# Patient Record
Sex: Female | Born: 1995 | Hispanic: Refuse to answer | Marital: Single | Smoking: Never smoker
Health system: Southern US, Community
[De-identification: ages and names within clinical notes are randomized; demographics above are authoritative.]

---
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
## 2017-08-03 ENCOUNTER — Encounter: Payer: Self-pay | Admitting: Family Medicine

## 2017-08-03 ENCOUNTER — Ambulatory Visit: Payer: Self-pay | Admitting: Family Medicine

## 2017-08-03 VITALS — BP 105/59 | HR 57 | Temp 97.6°F | Resp 14

## 2017-08-03 DIAGNOSIS — S86899A Other injury of other muscle(s) and tendon(s) at lower leg level, unspecified leg, initial encounter: Secondary | ICD-10-CM

## 2017-08-03 NOTE — Progress Notes (Signed)
Patient presents today for blood work. Patient had a history of right tibial stress injury last year. Her vitamin D level and ferritin level were normal in 10/2016. She denies any stress injuries this year. She does deal with shin splints on occasion. She does take a vitamin D/calcium supplement but states that it is not a high dose. She also takes iron and a multivitamin daily. She denies any restrictions in her diet.  Exam deferred.  A/P: History of stress injury - will repeat vitamin D level today and ferritin level. Will inform patient of any need for further supplementation.

## 2017-08-04 LAB — VITAMIN D 25 HYDROXY (VIT D DEFICIENCY, FRACTURES): VIT D 25 HYDROXY: 40.7 ng/mL (ref 30.0–100.0)

## 2017-08-04 LAB — FERRITIN: Ferritin: 55 ng/mL (ref 15–77)

## 2017-08-31 ENCOUNTER — Ambulatory Visit (INDEPENDENT_AMBULATORY_CARE_PROVIDER_SITE_OTHER): Payer: Commercial Managed Care - HMO | Admitting: Family Medicine

## 2017-08-31 ENCOUNTER — Encounter: Payer: Self-pay | Admitting: Family Medicine

## 2017-08-31 DIAGNOSIS — M25562 Pain in left knee: Secondary | ICD-10-CM

## 2017-08-31 MED ORDER — DICLOFENAC SODIUM 75 MG PO TBEC: 75.0000 mg | DELAYED_RELEASE_TABLET | Freq: Two times a day (BID) | ORAL | 0 refills | Status: DC

## 2017-10-12 ENCOUNTER — Ambulatory Visit (INDEPENDENT_AMBULATORY_CARE_PROVIDER_SITE_OTHER): Payer: Commercial Managed Care - HMO | Admitting: Family Medicine

## 2017-10-12 ENCOUNTER — Encounter: Payer: Self-pay | Admitting: Family Medicine

## 2017-10-12 VITALS — BP 120/63 | HR 63 | Temp 98.1°F | Resp 14

## 2017-10-12 DIAGNOSIS — J029 Acute pharyngitis, unspecified: Secondary | ICD-10-CM

## 2017-10-12 LAB — POCT RAPID STREP A (OFFICE): RAPID STREP A SCREEN: NEGATIVE

## 2017-10-12 NOTE — Progress Notes (Signed)
Patient presents today with symptoms of sore throat and left ear pain. Patient denies any fever. She states that she has had some postnasal drip. She has had minimal cough. She has been taking Ibuprofen for her symptoms.  ROS: Negative except mentioned above. Vitals as per Epic. GENERAL: NAD HEENT: mild pharyngeal erythema, no exudate, no erythema of TMs, no erythema of TMs, no tenderness with movement of pinna, no discharge from the ears, no cervical LAD RESP: CTA B CARD: RRR NEURO: CN II-XII grossly intact   A/P: Pharyngitis, left ear pain - rapid strep test negative, throat culture sent to lab, no acute otitis media or externa appreciated, will treat with antihistamine, decongestant, Ibuprofen/Tylenol when necessary. Seek medical attention if symptoms persist or worsen as discussed. No athletic activity or class if febrile.

## 2017-10-14 LAB — CULTURE, GROUP A STREP

## 2017-10-15 ENCOUNTER — Other Ambulatory Visit: Payer: Self-pay | Admitting: Family Medicine

## 2017-10-15 MED ORDER — AMOXICILLIN 500 MG PO CAPS: 500.0000 mg | ORAL_CAPSULE | Freq: Two times a day (BID) | ORAL | 0 refills | Status: AC

## 2017-12-14 ENCOUNTER — Ambulatory Visit (INDEPENDENT_AMBULATORY_CARE_PROVIDER_SITE_OTHER): Payer: Commercial Managed Care - HMO | Admitting: Family Medicine

## 2017-12-14 ENCOUNTER — Encounter: Payer: Self-pay | Admitting: Family Medicine

## 2017-12-14 VITALS — BP 115/56 | HR 95 | Temp 99.2°F | Resp 14

## 2017-12-14 DIAGNOSIS — R059 Cough, unspecified: Secondary | ICD-10-CM

## 2017-12-14 DIAGNOSIS — R05 Cough: Secondary | ICD-10-CM

## 2017-12-14 LAB — POCT INFLUENZA A/B
Influenza A, POC: NEGATIVE
Influenza B, POC: NEGATIVE

## 2017-12-14 NOTE — Progress Notes (Signed)
Patient presents today with symptoms of cough, fever, fatigue. Patient states that her symptoms started on Saturday. She has been in bed for the last 2 days due to the symptoms. She does feel short of breath at times due to her cough. She denies any history of asthma. She denies any chest pain, calf swelling or pain. She is not taking any medications today. She denies getting a flu vaccination. She has had some sick contacts with upper respiratory symptoms. She denies any nasal congestion or sore throat.  ROS: Negative except mentioned above. Vitals as per Epic. GENERAL: NAD HEENT: mild pharyngeal erythema, no exudate, no erythema of TMs, no cervical LAD RESP: No wheezing or rhonchi appreciated, mildly decreased breath sounds at bases CARD: RRR NEURO: CN II-XII grossly intact   A/P: URI - appears to be viral, influenza screen was negative in the office, will get chest x-ray, discussed taking Tylenol/Ibuprofen when necessary, Delsym when necessary, rest, hydration, no athletic activity unless afebrile for 24 hours without taking fever lowering medication. Seek medical attention as needed.

## 2017-12-15 ENCOUNTER — Ambulatory Visit
Admission: RE | Admit: 2017-12-15 | Discharge: 2017-12-15 | Disposition: A | Discharge status: Home / Self Care | Payer: 59 | Source: Ambulatory Visit | Attending: Family Medicine | Admitting: Family Medicine

## 2017-12-15 ENCOUNTER — Other Ambulatory Visit: Payer: Self-pay | Admitting: Family Medicine

## 2017-12-15 DIAGNOSIS — R05 Cough: Secondary | ICD-10-CM | POA: Diagnosis present

## 2017-12-15 DIAGNOSIS — J181 Lobar pneumonia, unspecified organism: Secondary | ICD-10-CM | POA: Diagnosis not present

## 2017-12-15 DIAGNOSIS — R059 Cough, unspecified: Secondary | ICD-10-CM

## 2017-12-15 MED ORDER — AZITHROMYCIN 250 MG PO TABS: ORAL_TABLET | ORAL | 0 refills | Status: AC

## 2017-12-15 MED ORDER — ALBUTEROL SULFATE HFA 108 (90 BASE) MCG/ACT IN AERS: 2.0000 | INHALATION_SPRAY | Freq: Four times a day (QID) | RESPIRATORY_TRACT | 2 refills | Status: AC | PRN

## 2018-01-25 ENCOUNTER — Encounter: Payer: Self-pay | Admitting: Family Medicine

## 2018-01-25 ENCOUNTER — Ambulatory Visit (INDEPENDENT_AMBULATORY_CARE_PROVIDER_SITE_OTHER): Payer: 59 | Admitting: Family Medicine

## 2018-01-25 VITALS — BP 108/61 | HR 67 | Temp 98.0°F | Resp 14

## 2018-01-25 DIAGNOSIS — D508 Other iron deficiency anemias: Secondary | ICD-10-CM

## 2018-01-25 DIAGNOSIS — E559 Vitamin D deficiency, unspecified: Secondary | ICD-10-CM

## 2018-01-25 NOTE — Progress Notes (Signed)
Patient presents today for labs. Patient has cross country runner. She is here to have her vitamin D and ferritin checked. No acute concerns at this time. Will discuss supplementation after results have been reviewed.

## 2018-01-26 LAB — VITAMIN D 25 HYDROXY (VIT D DEFICIENCY, FRACTURES): VIT D 25 HYDROXY: 31.3 ng/mL (ref 30.0–100.0)

## 2018-01-26 LAB — FERRITIN: FERRITIN: 51 ng/mL (ref 15–150)

## 2018-04-01 IMAGING — CR DG TIBIA/FIBULA 2V*L*
1 series · 2 of 2 positions shown · non-contrast
Comparison: None.

CLINICAL DATA: Pain in the left leg.  Patient is a runner.

EXAM:
LEFT TIBIA AND FIBULA - 2 VIEW

[Series 1: dg tibia/fibula left · 0.14mm/px · 2 of 2 slices shown]
[im 1/2]
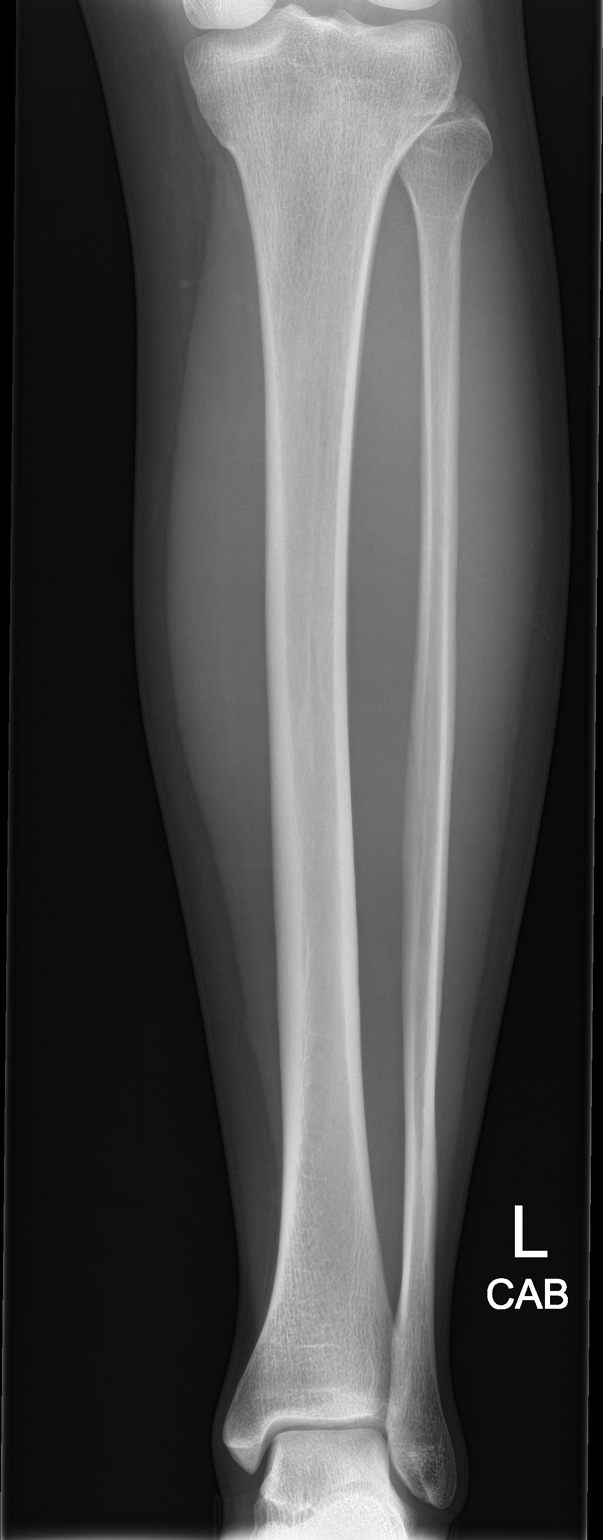
[im 2/2]
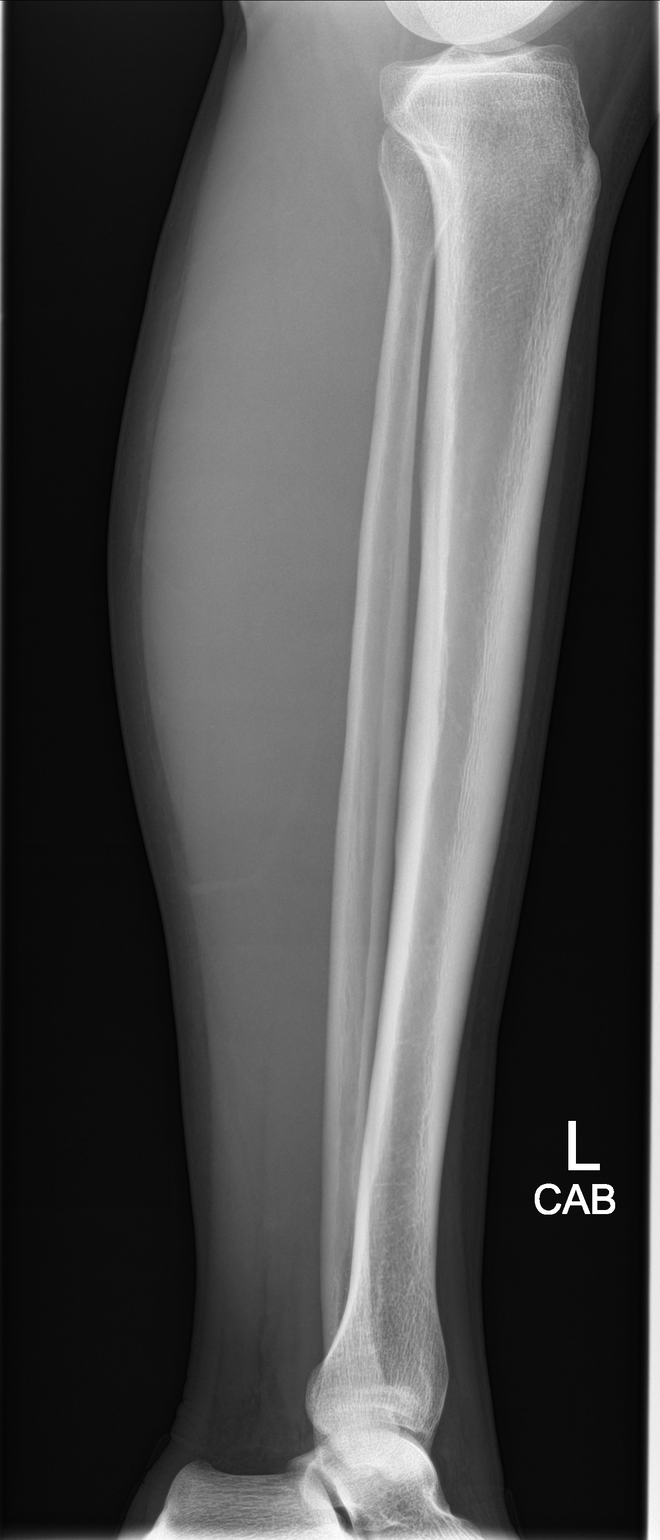

[2 of 2 positions shown; findings below may reference images not displayed]

FINDINGS: There is no evidence of fracture or other focal bone lesions. Soft
tissues are unremarkable.
IMPRESSION: Negative.

## 2018-08-13 NOTE — Progress Notes (Signed)
Presents today with symptoms of left posterior knee pain.  Patient states that she has had symptoms for the last few days.  She denies any particular injury or trauma to the area.  She is unsure of any swelling in the area.  She is able to walk without any problems.  She noticed some discomfort in the area after running.  She denies any bruising of the area or any pain anywhere else.  She denies any previous history of any left knee problems.  She has not taken any medication on a consistent basis since her symptoms started.  ROS: Negative except mentioned above. Vitals as per Epic.  GENERAL: NAD MSK: L Knee -no effusion, mild discomfort to palpation in the central posterior knee area full range of motion, no joint line tenderness, negative Lockman, negative drawer, negative McMurray, no obvious Baker's cyst appreciated, mild discomfort in the area with resisted hamstring testing, normal gait, -Homans, N/V intact NEURO: CN II-XII grossly intact   A/P: Left posterior knee pain -no meniscal or ligamentous injury suspected, asked patient to try NSAIDs and rest for the week with hamstring stretching/strengthening as tolerated, can advance activity slowly with assistance of athletic trainer as tolerated, if symptoms do persist or worsen will do imaging, patient addresses understanding and will seek medical attention if needed.

## 2018-09-20 ENCOUNTER — Ambulatory Visit (INDEPENDENT_AMBULATORY_CARE_PROVIDER_SITE_OTHER): Payer: 59 | Admitting: Family Medicine

## 2018-09-20 ENCOUNTER — Ambulatory Visit
Admission: RE | Admit: 2018-09-20 | Discharge: 2018-09-20 | Disposition: A | Discharge status: Home / Self Care | Payer: 59 | Source: Ambulatory Visit | Attending: Family Medicine | Admitting: Family Medicine

## 2018-09-20 VITALS — Temp 98.2°F | Resp 14

## 2018-09-20 DIAGNOSIS — M25561 Pain in right knee: Secondary | ICD-10-CM

## 2018-09-20 NOTE — Progress Notes (Signed)
Patient presents today with right knee pain for few weeks.  Patient states that her symptoms started mid-October.  She started to notice some pain along the lateral aspect of her knee.  She denies any injury or trauma to the knee that she can recall.  Most of her pain is with walking and running.  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  She denies any significant changes in shoewear.  She does state that she changed her shoes out a little later than she usually does.  She denies any significant swelling or bruising of the area.  She states that of recent she has noticed the pain going into her lateral lower leg.  She denies any tingling or numbness of the foot.  She denies any pain in the lower back.  She has been able to compete and cross-country however does not feel that she has performed at her best. Patient's vitamin D level was checked 01/2018 and was 31.  She admits that she has a vitamin D supplement but does not take it consistently.  ROS: Negative except mentioned above. Vitals as per Epic. GENERAL: NAD RESP: CTA B CARD: RRR MSK: R Knee - no effusion, full range of motion, negative McMurray, negative Lachman, negative Drawer, negative Apprehension, no significant laxity with varus or valgus stress, negative Ober's, N/V intact NEURO: CN II-XII grossly intact   A/P: Right knee pain -unclear as to etiology, will get x-rays and MRI given length of time, would do crosstraining for now if tolerated, NSAIDs as needed, encouraged patient to take her vitamin D supplement, will discuss plan above with athletic trainer.

## 2018-09-23 ENCOUNTER — Other Ambulatory Visit: Payer: Self-pay | Admitting: Family Medicine

## 2018-09-23 DIAGNOSIS — M25561 Pain in right knee: Secondary | ICD-10-CM

## 2018-09-24 ENCOUNTER — Ambulatory Visit
Admission: RE | Admit: 2018-09-24 | Discharge: 2018-09-24 | Disposition: A | Discharge status: Home / Self Care | Payer: PRIVATE HEALTH INSURANCE | Source: Ambulatory Visit | Attending: Family Medicine | Admitting: Family Medicine

## 2018-09-24 DIAGNOSIS — M25561 Pain in right knee: Secondary | ICD-10-CM | POA: Diagnosis not present

## 2018-09-27 ENCOUNTER — Ambulatory Visit (INDEPENDENT_AMBULATORY_CARE_PROVIDER_SITE_OTHER): Payer: 59 | Admitting: Family Medicine

## 2018-09-27 VITALS — Resp 14

## 2018-09-27 DIAGNOSIS — M25561 Pain in right knee: Secondary | ICD-10-CM | POA: Diagnosis not present

## 2018-09-27 MED ORDER — DICLOFENAC SODIUM 75 MG PO TBEC: 75.0000 mg | DELAYED_RELEASE_TABLET | Freq: Two times a day (BID) | ORAL | 0 refills | Status: AC

## 2018-09-28 NOTE — Progress Notes (Signed)
Patient presents today to discuss her MRI results.  MRI revealed some patella tendinosis.  There was no evidence of stress injury.  Patient states that she has been feeling better since not running for the last few days.  She denies any swelling or problems with range of motion.  She denies any clicking or popping of the knee.  MRI was reviewed with Dr. Ardine Engiehl this past weekend.  ROS: Negative except mentioned above. Vitals as per Epic.  GENERAL: NAD MSK: Right Knee -no effusion, full range of motion, no tenderness to palpation, negative McMurray, negative Lachman, negative apprehension, no laxity with varus or valgus stress, N/V intact NEURO: CN II-XII grossly intact   A/P: Right knee pain -results with patient, patient does not have any significant pain over the patella tendon, patient plans to advance activity as tolerated this week, if she develops any pain she is not going to race and regionals this weekend, follow-up as needed with myself or Dr. Ardine Engiehl.  Above was discussed with athletic trainer.

## 2018-10-18 ENCOUNTER — Ambulatory Visit (INDEPENDENT_AMBULATORY_CARE_PROVIDER_SITE_OTHER): Payer: PRIVATE HEALTH INSURANCE | Admitting: Family Medicine

## 2018-10-18 ENCOUNTER — Encounter: Payer: Self-pay | Admitting: Family Medicine

## 2018-10-18 VITALS — Temp 98.1°F | Resp 14

## 2018-10-18 DIAGNOSIS — B369 Superficial mycosis, unspecified: Secondary | ICD-10-CM

## 2018-10-18 MED ORDER — CLOTRIMAZOLE-BETAMETHASONE 1-0.05 % EX CREA: 1.0000 "application " | TOPICAL_CREAM | Freq: Two times a day (BID) | CUTANEOUS | 0 refills | Status: AC

## 2018-10-18 NOTE — Progress Notes (Signed)
Patient presents today with a red circular itchy rash on her left antecubital area.  Patient states that she has had the rash since October.  It started off as a small circular area but has now gotten larger.  She denies any discharge from the area.  She denies any other rash anywhere else.  She denies any history of fever, joint pain, headache, fatigue.  She does have a cat and a dog back in [place] where she is from.  She denies putting any ointment on the area.  She does wear layers of clothing when she runs but does not strap anything on that area.  She denies any tick bites.  ROS: Negative except mentioned above. Vitals per Epic.  GENERAL: NAD SKIN: Approximately 2 inch circular erythematous area in the left antecubital area, slight scaling of the area, no other rash noted on the torso, extremities, face NEURO: CN II-XII grossly intact   A/P: Skin rash left upper extremity -appears to be fungal, will prescribe Lotrisone, if symptoms do persist or worsen patient is to follow-up with her physician at home in [place].  Encourage patient to keep the area covered with long sleeves when around others and in the weight room.

## 2018-12-30 ENCOUNTER — Ambulatory Visit (INDEPENDENT_AMBULATORY_CARE_PROVIDER_SITE_OTHER): Payer: 59 | Admitting: Family Medicine

## 2018-12-30 DIAGNOSIS — M25562 Pain in left knee: Secondary | ICD-10-CM

## 2018-12-30 NOTE — Progress Notes (Signed)
Patient presents today with symptoms of left knee pain.  Patient states that she has had symptoms for the last 2 weeks or so.  Initially when she noticed the pain it was about a 8 out of 10.  She now states the pain is about a 4 out of 10.  She denies any injury or trauma to the knee.  She denies any clicking, locking of the knee.  She denies any effusion or erythema of the knee.  She denies any history of any left knee problems in the past.  Does not 19 and was 31.  She states that she has been inconsistent with taking her vitamin D3 supplement.  She does admit that her shoes were slightly worn out prior to when her symptoms began. She denies any menstrual irregularities.  She denies any significant weight loss or weight gain.  She denies any pain with walking at this time.  She denies any pain with crosstraining.  ROS: Negative except mentioned above. Vitals as per Epic. GENERAL: NAD MSK: L Knee -no effusion, full range of motion, no significant joint line tenderness, minimal tenderness along medial border of the patella tendon, there is also mild tenderness along the proximal tibial plateau, negative Lockman, negative McMurray, no significant varus or valgus instability, N/V intact, normal gait NEURO: CN II-XII grossly intact   A/P: Left Knee Pain -patient has improved with rest and crosstraining, patient is concerned about radiation given imaging done in the past on other areas, explained to the patient that my concern for a stress fracture is low but she may have a stress reaction, currently has no pain with walking, recommend continuing crosstraining for the next 1 to 2 weeks, if symptoms persist/worsen would recommend doing imaging, will discuss plan with athletic trainer/PT, encourage patient to be compliant with vitamin D3 supplement, seek medical attention if any acute concerns.

## 2019-08-08 ENCOUNTER — Other Ambulatory Visit: Payer: Self-pay

## 2019-08-08 DIAGNOSIS — U071 COVID-19: Secondary | ICD-10-CM

## 2019-08-09 LAB — NOVEL CORONAVIRUS, NAA: SARS-CoV-2, NAA: NOT DETECTED

## 2020-04-17 IMAGING — CR DG KNEE COMPLETE 4+V*R*
1 series · 4 of 4 positions shown · non-contrast
Comparison: 09/03/2016

CLINICAL DATA: Knee pain.  Cross country runner.

EXAM:
RIGHT KNEE - COMPLETE 4+ VIEW

[Series 1: dg knee complete 4 views right · 0.14mm/px · 4 of 4 slices shown]
[im 1/4]
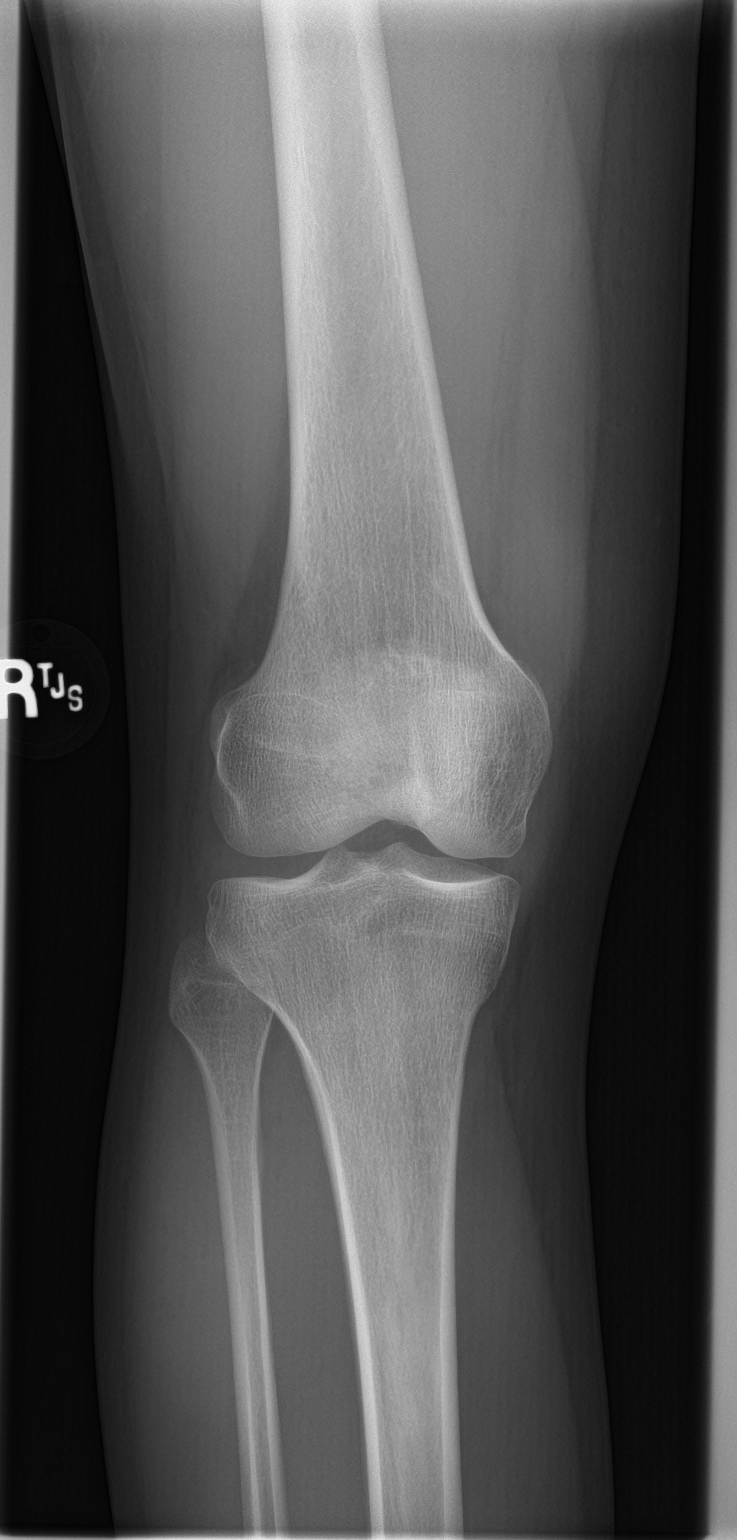
[im 2/4]
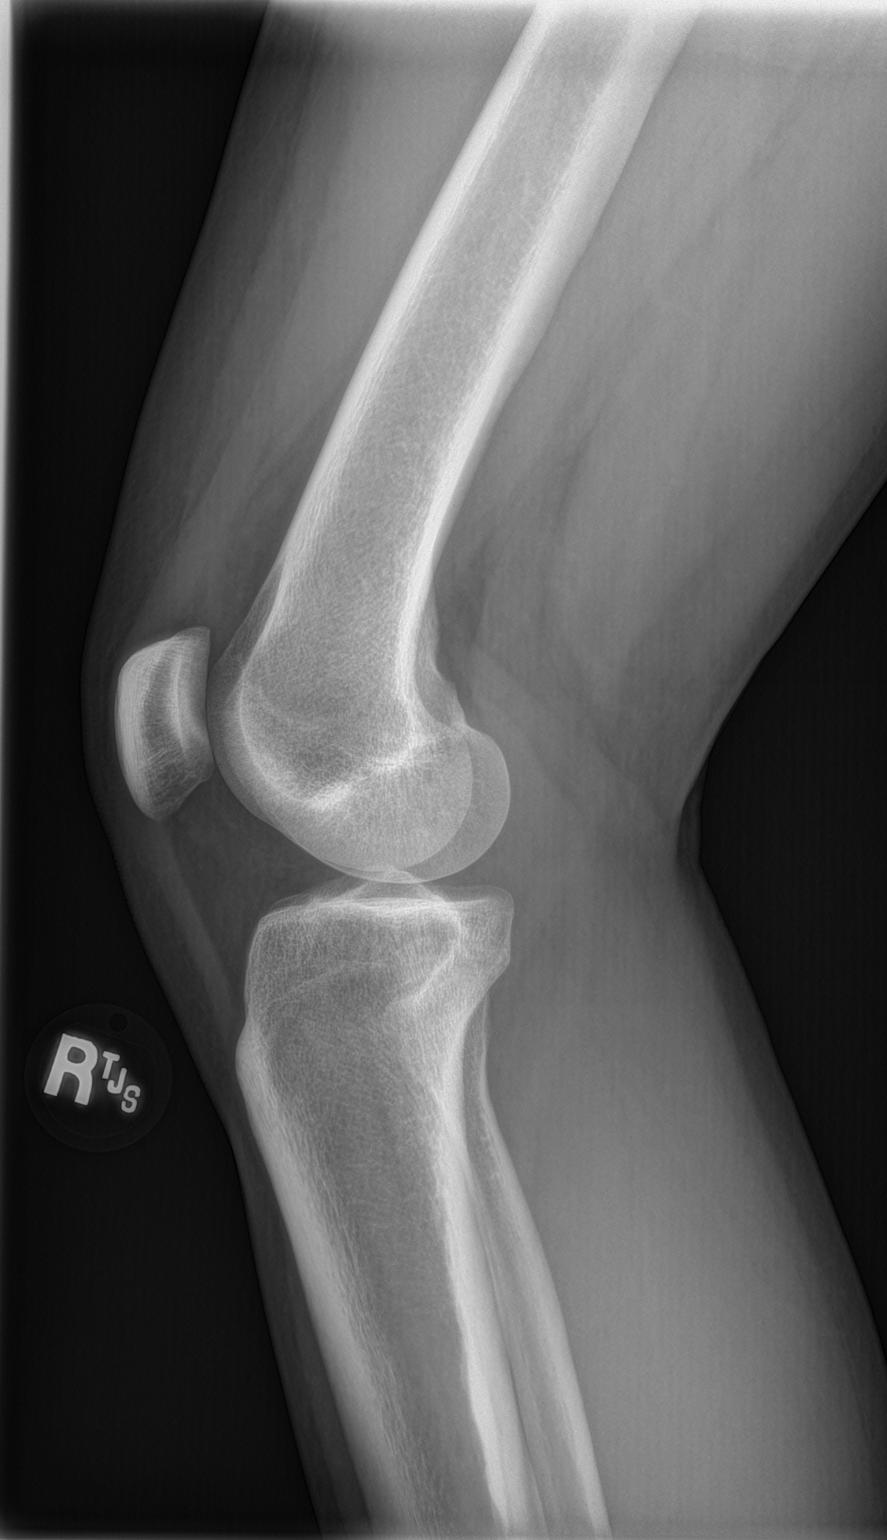
[im 3/4]
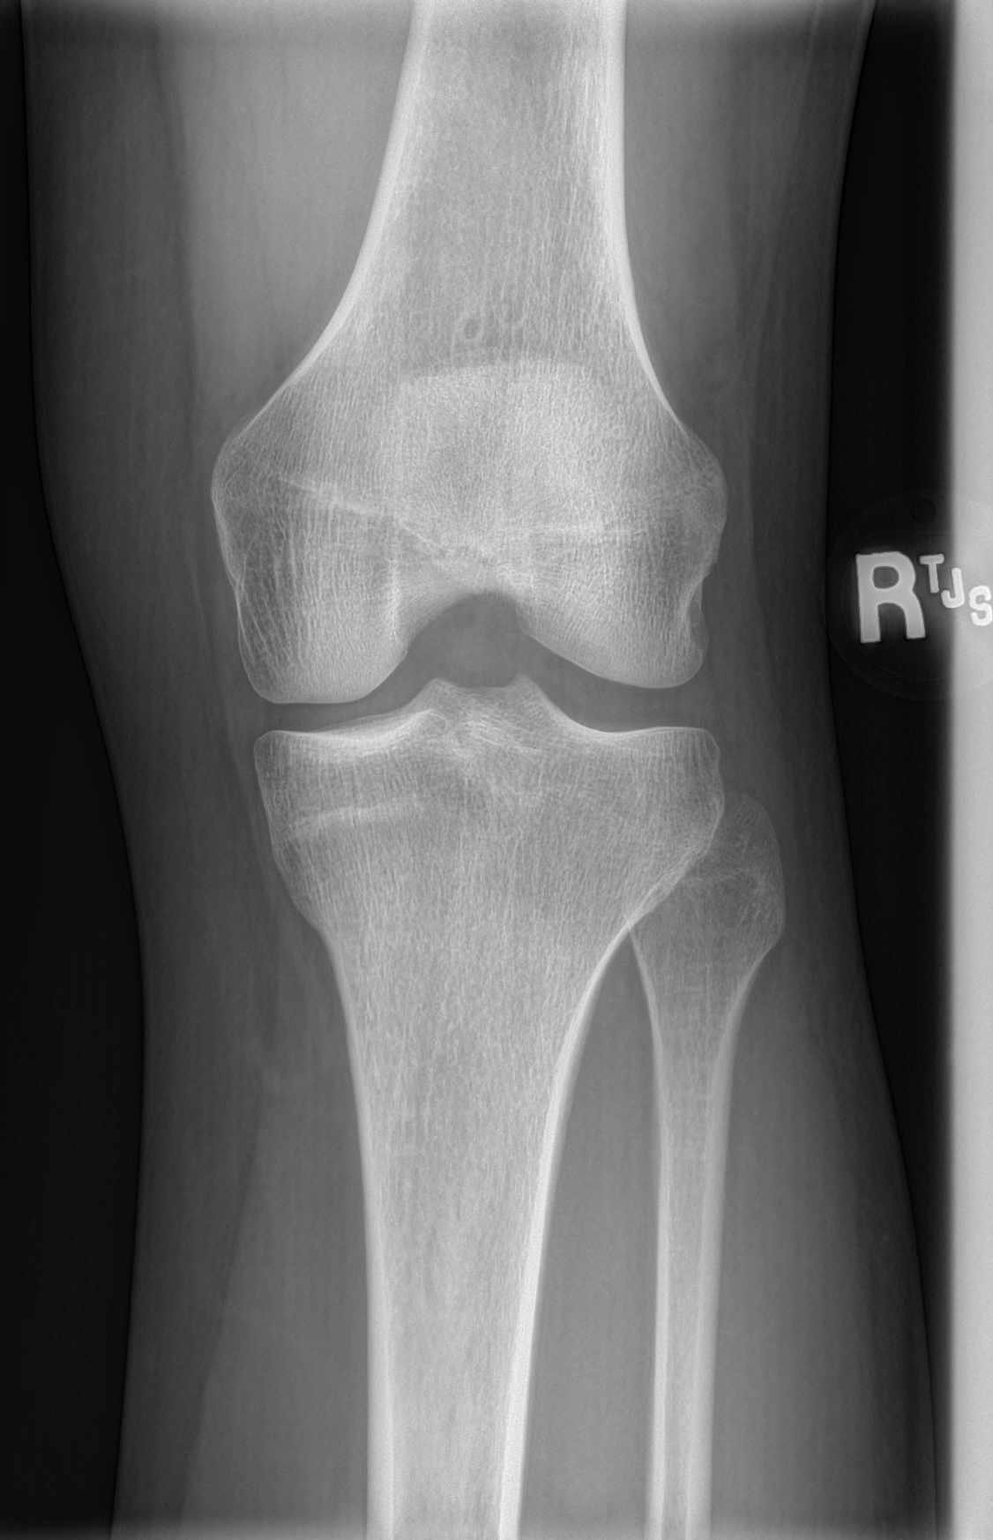
[im 4/4]
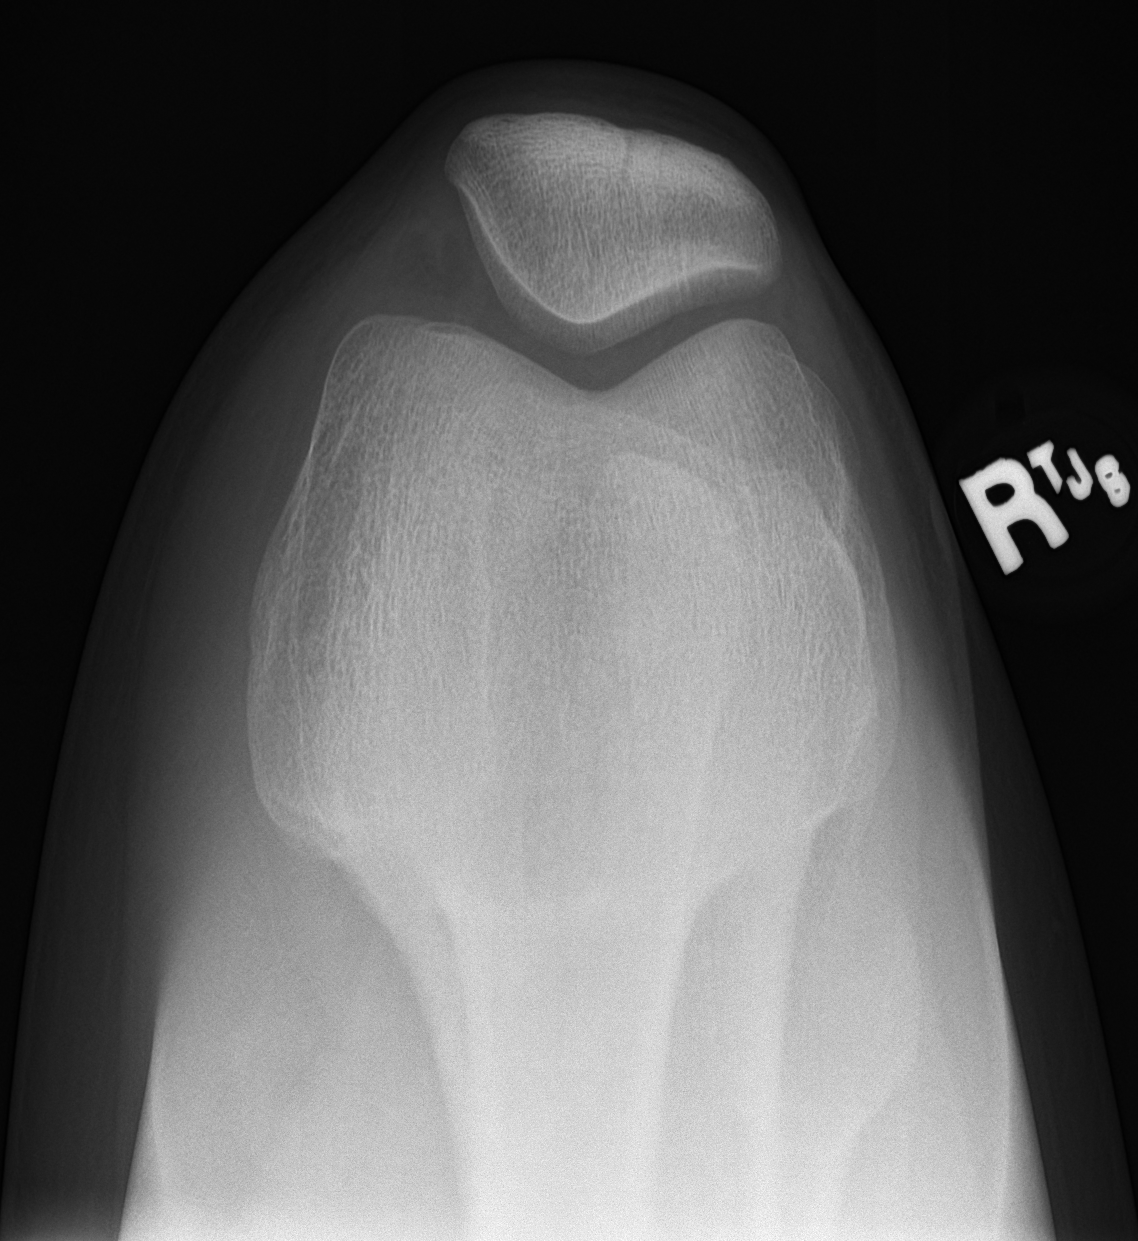

[4 of 4 positions shown; findings below may reference images not displayed]

FINDINGS: The joint spaces are normal. No acute bony findings. No
osteochondral lesion. No joint effusion. No plain film findings for
a stress fracture.
IMPRESSION: No acute bony findings.
